# Patient Record
Sex: Female | Born: 1970 | Race: White | Hispanic: No | Marital: Married | Smoking: Never smoker
Health system: Southern US, Community
[De-identification: ages and names within clinical notes are randomized; demographics above are authoritative.]

## PROBLEM LIST (undated history)

## (undated) DIAGNOSIS — C44519 Basal cell carcinoma of skin of other part of trunk: Secondary | ICD-10-CM

## (undated) DIAGNOSIS — F329 Major depressive disorder, single episode, unspecified: Secondary | ICD-10-CM

## (undated) DIAGNOSIS — F32A Depression, unspecified: Secondary | ICD-10-CM

## (undated) DIAGNOSIS — D235 Other benign neoplasm of skin of trunk: Secondary | ICD-10-CM

## (undated) HISTORY — DX: Basal cell carcinoma of skin of other part of trunk: C44.519

## (undated) HISTORY — DX: Other benign neoplasm of skin of trunk: D23.5

## (undated) HISTORY — PX: NO PAST SURGERIES: SHX2092

---

## 2007-09-15 ENCOUNTER — Ambulatory Visit: Payer: Self-pay | Admitting: Family Medicine

## 2009-05-01 ENCOUNTER — Ambulatory Visit: Payer: Self-pay | Admitting: Family Medicine

## 2009-10-31 ENCOUNTER — Ambulatory Visit: Payer: Self-pay | Admitting: Family Medicine

## 2010-04-30 DIAGNOSIS — D235 Other benign neoplasm of skin of trunk: Secondary | ICD-10-CM

## 2010-04-30 HISTORY — DX: Other benign neoplasm of skin of trunk: D23.5

## 2014-02-10 ENCOUNTER — Ambulatory Visit: Payer: Self-pay | Admitting: Internal Medicine

## 2015-09-05 ENCOUNTER — Other Ambulatory Visit: Payer: Self-pay | Admitting: Family Medicine

## 2015-09-05 DIAGNOSIS — E040 Nontoxic diffuse goiter: Secondary | ICD-10-CM

## 2015-10-30 ENCOUNTER — Ambulatory Visit
Admission: RE | Admit: 2015-10-30 | Discharge: 2015-10-30 | Disposition: A | Discharge status: Home / Self Care | Payer: BC Managed Care – PPO | Source: Ambulatory Visit | Attending: Family Medicine | Admitting: Family Medicine

## 2015-10-30 DIAGNOSIS — E040 Nontoxic diffuse goiter: Secondary | ICD-10-CM

## 2015-11-09 ENCOUNTER — Other Ambulatory Visit: Payer: Self-pay | Admitting: Family Medicine

## 2015-11-09 DIAGNOSIS — E041 Nontoxic single thyroid nodule: Secondary | ICD-10-CM

## 2015-11-20 ENCOUNTER — Ambulatory Visit: Admission: RE | Admit: 2015-11-20 | Payer: BC Managed Care – PPO | Source: Ambulatory Visit

## 2015-12-07 ENCOUNTER — Ambulatory Visit
Admission: RE | Admit: 2015-12-07 | Discharge: 2015-12-07 | Disposition: A | Discharge status: Home / Self Care | Payer: BC Managed Care – PPO | Source: Ambulatory Visit | Attending: Family Medicine | Admitting: Family Medicine

## 2015-12-07 DIAGNOSIS — E041 Nontoxic single thyroid nodule: Secondary | ICD-10-CM | POA: Diagnosis not present

## 2015-12-07 HISTORY — DX: Major depressive disorder, single episode, unspecified: F32.9

## 2015-12-07 HISTORY — DX: Depression, unspecified: F32.A

## 2015-12-07 NOTE — Procedures (Signed)
L thyroid nodule FNA No comp/EBL

## 2015-12-08 LAB — CYTOLOGY - NON PAP

## 2017-05-26 DIAGNOSIS — C44519 Basal cell carcinoma of skin of other part of trunk: Secondary | ICD-10-CM

## 2017-05-26 HISTORY — DX: Basal cell carcinoma of skin of other part of trunk: C44.519

## 2017-09-17 ENCOUNTER — Other Ambulatory Visit: Payer: Self-pay | Admitting: Family Medicine

## 2017-09-17 DIAGNOSIS — N950 Postmenopausal bleeding: Secondary | ICD-10-CM

## 2017-09-22 ENCOUNTER — Ambulatory Visit: Payer: BC Managed Care – PPO

## 2018-09-26 ENCOUNTER — Other Ambulatory Visit: Payer: Self-pay | Admitting: *Deleted

## 2018-09-26 DIAGNOSIS — Z20822 Contact with and (suspected) exposure to covid-19: Secondary | ICD-10-CM

## 2018-10-02 ENCOUNTER — Telehealth: Payer: Self-pay | Admitting: Family Medicine

## 2018-10-02 LAB — NOVEL CORONAVIRUS, NAA: SARS-CoV-2, NAA: NOT DETECTED

## 2018-10-02 NOTE — Telephone Encounter (Signed)
Patient was given her COVID results. Patient expressed understanding of results.

## 2019-09-06 ENCOUNTER — Other Ambulatory Visit: Payer: Self-pay

## 2019-09-06 ENCOUNTER — Ambulatory Visit: Payer: BC Managed Care – PPO | Admitting: Dermatology

## 2019-09-06 DIAGNOSIS — L720 Epidermal cyst: Secondary | ICD-10-CM

## 2019-09-06 MED ORDER — DOXYCYCLINE HYCLATE 100 MG PO CAPS: 100.0000 mg | ORAL_CAPSULE | Freq: Two times a day (BID) | ORAL | 0 refills | Status: DC

## 2019-09-06 NOTE — Patient Instructions (Signed)
Doxycycline 100mg   Take 1 pill two times a day with food and drink for 2 weeks, then decrease to 1 pill a day for the remainder of the prescription.

## 2019-09-06 NOTE — Progress Notes (Signed)
   Follow-Up Visit   Subjective  Dana Heath is a 49 y.o. female who presents for the following: Cyst (back x 2wks, painful, no hx draining).   The following portions of the chart were reviewed this encounter and updated as appropriate:      Review of Systems:  No other skin or systemic complaints except as noted in HPI or Assessment and Plan.  Objective  Well appearing patient in no apparent distress; mood and affect are within normal limits.  A focused examination was performed including back. Relevant physical exam findings are noted in the Assessment and Plan.  Objective  Right Upper Back: 2.5cm firm sub q nodule with overlying mild erythema   Assessment & Plan  Epidermal cyst Right Upper Back  Inflamed  Start Doxycycline 100mg  1 po bid x 2 weeks, then 1 po qd until finish, take with food and drink  Discussed excising in future  Doxycycline should be taken with food to prevent nausea. Do not lay down for 30 minutes after taking. Be cautious with sun exposure and use good sun protection while on this medication. Pregnant women should not take this medication.    doxycycline (VIBRAMYCIN) 100 MG capsule - Right Upper Back  Return for as scheduled with Dr. Nehemiah Massed.   I, Othelia Pulling, RMA, am acting as scribe for Brendolyn Patty, MD .  Documentation: I have reviewed the above documentation for accuracy and completeness, and I agree with the above.  Brendolyn Patty MD

## 2019-09-20 ENCOUNTER — Other Ambulatory Visit: Payer: Self-pay

## 2019-09-20 ENCOUNTER — Ambulatory Visit: Payer: BC Managed Care – PPO | Admitting: Dermatology

## 2019-09-20 DIAGNOSIS — D1800 Hemangioma unspecified site: Secondary | ICD-10-CM

## 2019-09-20 DIAGNOSIS — Z1283 Encounter for screening for malignant neoplasm of skin: Secondary | ICD-10-CM | POA: Diagnosis not present

## 2019-09-20 DIAGNOSIS — D239 Other benign neoplasm of skin, unspecified: Secondary | ICD-10-CM

## 2019-09-20 DIAGNOSIS — L570 Actinic keratosis: Secondary | ICD-10-CM | POA: Diagnosis not present

## 2019-09-20 DIAGNOSIS — D225 Melanocytic nevi of trunk: Secondary | ICD-10-CM | POA: Diagnosis not present

## 2019-09-20 DIAGNOSIS — L578 Other skin changes due to chronic exposure to nonionizing radiation: Secondary | ICD-10-CM

## 2019-09-20 DIAGNOSIS — L720 Epidermal cyst: Secondary | ICD-10-CM

## 2019-09-20 DIAGNOSIS — D229 Melanocytic nevi, unspecified: Secondary | ICD-10-CM

## 2019-09-20 DIAGNOSIS — Z85828 Personal history of other malignant neoplasm of skin: Secondary | ICD-10-CM

## 2019-09-20 DIAGNOSIS — L739 Follicular disorder, unspecified: Secondary | ICD-10-CM

## 2019-09-20 DIAGNOSIS — D2371 Other benign neoplasm of skin of right lower limb, including hip: Secondary | ICD-10-CM

## 2019-09-20 DIAGNOSIS — R21 Rash and other nonspecific skin eruption: Secondary | ICD-10-CM | POA: Diagnosis not present

## 2019-09-20 DIAGNOSIS — D492 Neoplasm of unspecified behavior of bone, soft tissue, and skin: Secondary | ICD-10-CM

## 2019-09-20 DIAGNOSIS — L821 Other seborrheic keratosis: Secondary | ICD-10-CM

## 2019-09-20 DIAGNOSIS — L814 Other melanin hyperpigmentation: Secondary | ICD-10-CM

## 2019-09-20 MED ORDER — TRIAMCINOLONE ACETONIDE 0.1 % EX CREA: 1.0000 "application " | TOPICAL_CREAM | Freq: Two times a day (BID) | CUTANEOUS | 1 refills | Status: AC | PRN

## 2019-09-20 NOTE — Progress Notes (Signed)
Follow-Up Visit   Subjective  Dana Heath is a 49 y.o. female who presents for the following: Annual Exam (TBSE, 1 year f/u, Hx of BCC,  hx of AKs ) and Rash (pt c/o red rash on her body, she started Doxycyline 2 weeks ago for a cyst ). The patient presents for Total-Body Skin Exam (TBSE) for skin cancer screening and mole check.  The following portions of the chart were reviewed this encounter and updated as appropriate:  Tobacco  Allergies  Meds  Problems  Med Hx  Surg Hx  Fam Hx     Review of Systems:  No other skin or systemic complaints except as noted in HPI or Assessment and Plan.  Objective  Well appearing patient in no apparent distress; mood and affect are within normal limits.  A full examination was performed including scalp, head, eyes, ears, nose, lips, neck, chest, axillae, abdomen, back, buttocks, bilateral upper extremities, bilateral lower extremities, hands, feet, fingers, toes, fingernails, and toenails. All findings within normal limits unless otherwise noted below.  Objective  Right Upper Back: 1.0 cm Subcutaneous nodule.   Objective  Right Upper Back: Firm pink/brown papulenodule with dimple sign.   Objective  R cheek: Erythematous thin papules/macules with gritty scale.   Objective  lower legs: folliculitis   Objective  R upper gastric: 0.6 cm irregular brown macule    Assessment & Plan    Epidermal cyst Right Upper Back Benign.  Discussed surgical option.  Dermatofibroma Right Upper Back benign Observe   AK (actinic keratosis) R cheek  Destruction of lesion - R cheek Complexity: simple   Destruction method: cryotherapy   Informed consent: discussed and consent obtained   Timeout:  patient name, date of birth, surgical site, and procedure verified Lesion destroyed using liquid nitrogen: Yes   Region frozen until ice ball extended beyond lesion: Yes   Outcome: patient tolerated procedure well with no complications    Post-procedure details: wound care instructions given    Folliculitis lower legs  Cont Doxycycline 100mg  bid x 2 days with food then stop Doxycycline   Rash - Phototoxic Sunburn from doxycycline arms, legs  Photo toxic sunburn from Doxycyline   Start Triamcinolone 0.1% cream apply to skin qd-bid avoid f/g/a   Ordered Medications: triamcinolone cream (KENALOG) 0.1 %  Neoplasm of skin R upper gastric = epigastric  Epidermal / dermal shaving  Lesion diameter (cm):  0.6 Informed consent: discussed and consent obtained   Timeout: patient name, date of birth, surgical site, and procedure verified   Procedure prep:  Patient was prepped and draped in usual sterile fashion Prep type:  Isopropyl alcohol Anesthesia: the lesion was anesthetized in a standard fashion   Anesthetic:  1% lidocaine w/ epinephrine 1-100,000 buffered w/ 8.4% NaHCO3 Hemostasis achieved with: pressure, aluminum chloride and electrodesiccation   Outcome: patient tolerated procedure well   Post-procedure details: sterile dressing applied and wound care instructions given   Dressing type: bandage and petrolatum   Additional details:  Post defect 0.9 cm   Specimen 1 - Surgical pathology Differential Diagnosis: R/O Dysplastic nevus  Check Margins: No 0.6 cm irregular brown macule   Lentigines - Scattered tan macules - Discussed due to sun exposure - Benign, observe - Call for any changes  Seborrheic Keratoses - Stuck-on, waxy, tan-brown papules and plaques  - Discussed benign etiology and prognosis. - Observe - Call for any changes  Melanocytic Nevi - Tan-brown and/or pink-flesh-colored symmetric macules and papules - Benign appearing on  exam today - Observation - Call clinic for new or changing moles - Recommend daily use of broad spectrum spf 30+ sunscreen to sun-exposed areas.   Hemangiomas - Red papules - Discussed benign nature - Observe - Call for any changes  Actinic Damage -  diffuse scaly erythematous macules with underlying dyspigmentation - Recommend daily broad spectrum sunscreen SPF 30+ to sun-exposed areas, reapply every 2 hours as needed.  - Call for new or changing lesions.  History of Basal Cell Carcinoma of the Skin Back  - No evidence of recurrence today - Recommend regular full body skin exams - Recommend daily broad spectrum sunscreen SPF 30+ to sun-exposed areas, reapply every 2 hours as needed.  - Call if any new or changing lesions are noted between office visits  Skin cancer screening performed today.  Return in about 1 year (around 09/19/2020). IMarye Round, CMA, am acting as scribe for Sarina Ser, MD .  Documentation: I have reviewed the above documentation for accuracy and completeness, and I agree with the above.  Sarina Ser, MD

## 2019-09-20 NOTE — Patient Instructions (Signed)

## 2019-09-21 ENCOUNTER — Encounter: Payer: Self-pay | Admitting: Dermatology

## 2019-09-22 ENCOUNTER — Telehealth: Payer: Self-pay

## 2019-09-22 NOTE — Telephone Encounter (Signed)
-----   Message from Ralene Bathe, MD sent at 09/21/2019  7:21 PM EDT ----- Skin , right upper gastric MELANOCYTIC NEVUS, INTRADERMAL TYPE  Benign mole

## 2019-09-22 NOTE — Telephone Encounter (Signed)
Patient advised biopsy results showed benign nevus, Dana Heath

## 2020-05-15 ENCOUNTER — Ambulatory Visit
Admission: RE | Admit: 2020-05-15 | Discharge: 2020-05-15 | Disposition: A | Discharge status: Home / Self Care | Payer: BC Managed Care – PPO | Source: Ambulatory Visit | Attending: Family Medicine | Admitting: Family Medicine

## 2020-05-15 ENCOUNTER — Other Ambulatory Visit: Payer: Self-pay | Admitting: Family Medicine

## 2020-05-15 ENCOUNTER — Other Ambulatory Visit: Payer: Self-pay

## 2020-05-15 DIAGNOSIS — Z1231 Encounter for screening mammogram for malignant neoplasm of breast: Secondary | ICD-10-CM

## 2020-09-21 ENCOUNTER — Encounter: Payer: BC Managed Care – PPO | Admitting: Dermatology

## 2020-11-09 ENCOUNTER — Ambulatory Visit: Payer: BC Managed Care – PPO | Admitting: Dermatology

## 2020-11-16 ENCOUNTER — Ambulatory Visit: Payer: BC Managed Care – PPO | Admitting: Dermatology

## 2021-04-05 ENCOUNTER — Ambulatory Visit: Payer: BC Managed Care – PPO | Admitting: Dermatology

## 2021-08-25 IMAGING — MG MM DIGITAL SCREENING BILAT W/ TOMO AND CAD
6 of 10 series · 6 of 30 positions shown · non-contrast
Comparison: Previous exam(s).

CLINICAL DATA: Screening.

EXAM:
DIGITAL SCREENING BILATERAL MAMMOGRAM WITH TOMOSYNTHESIS AND CAD
TECHNIQUE: Bilateral screening digital craniocaudal and mediolateral oblique
mammograms were obtained. Bilateral screening digital breast
tomosynthesis was performed. The images were evaluated with
computer-aided detection.

[L CC synth-2D]
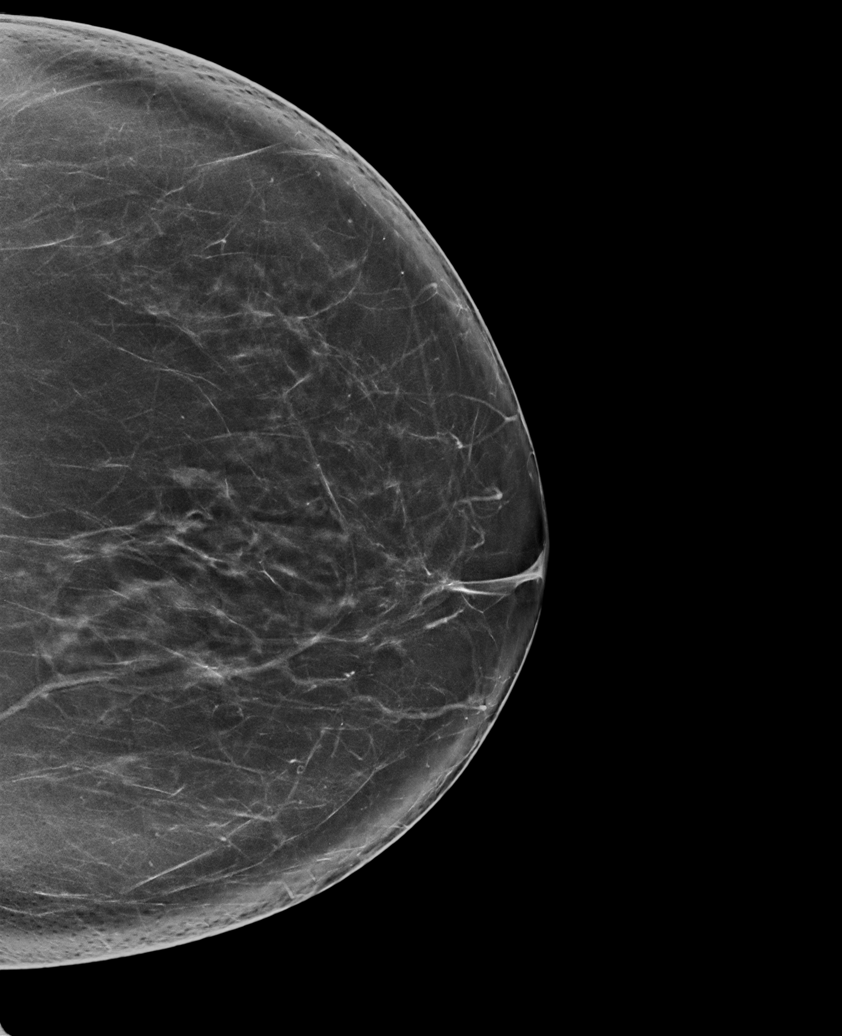

[R CC synth-2D (1 of 2)]
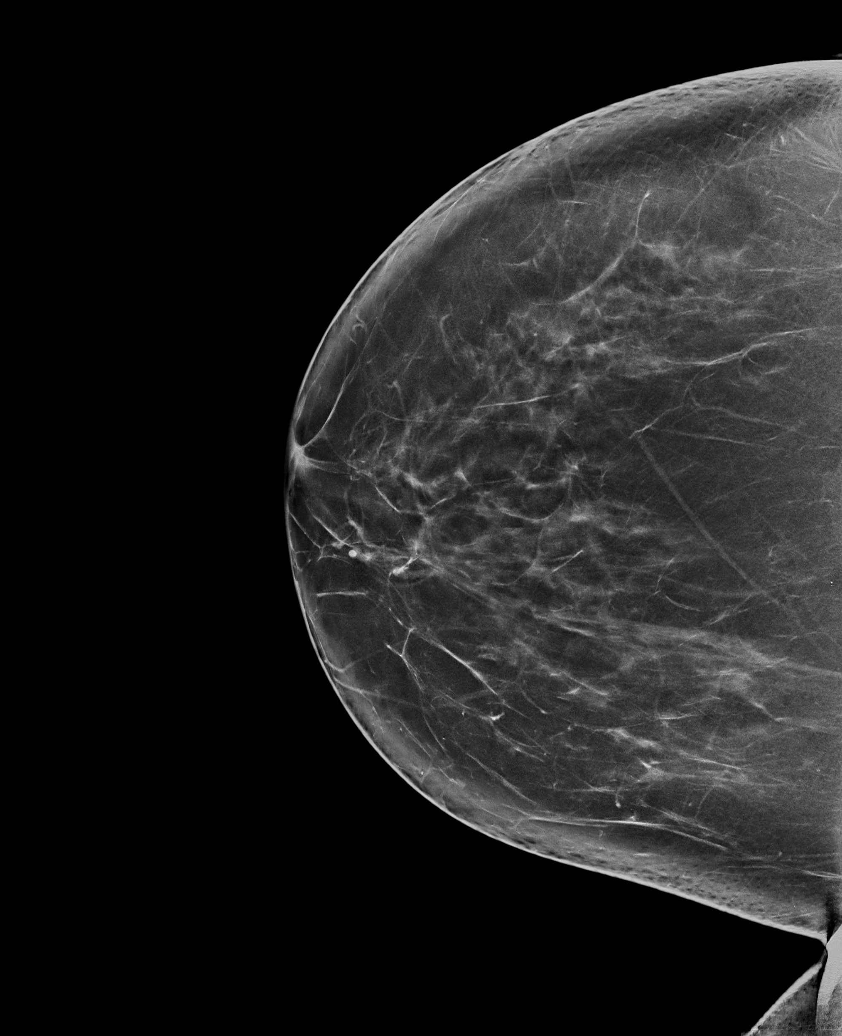

[L MLO synth-2D]
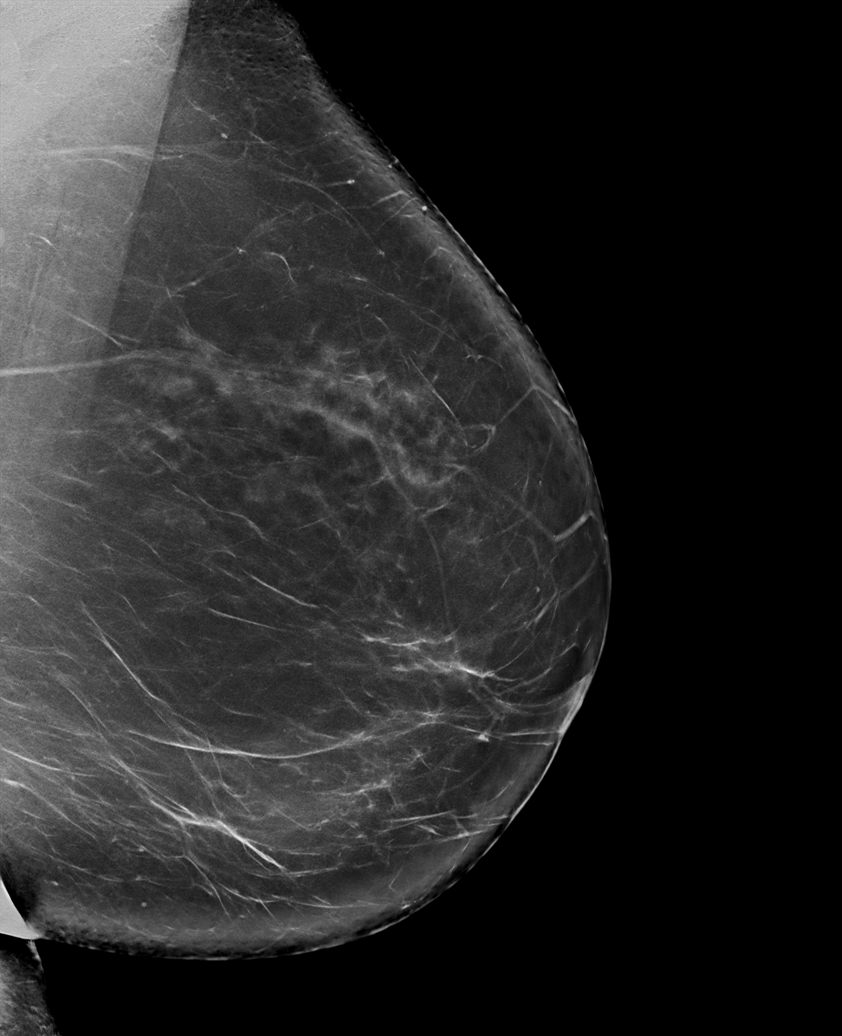

[R MLO synth-2D]
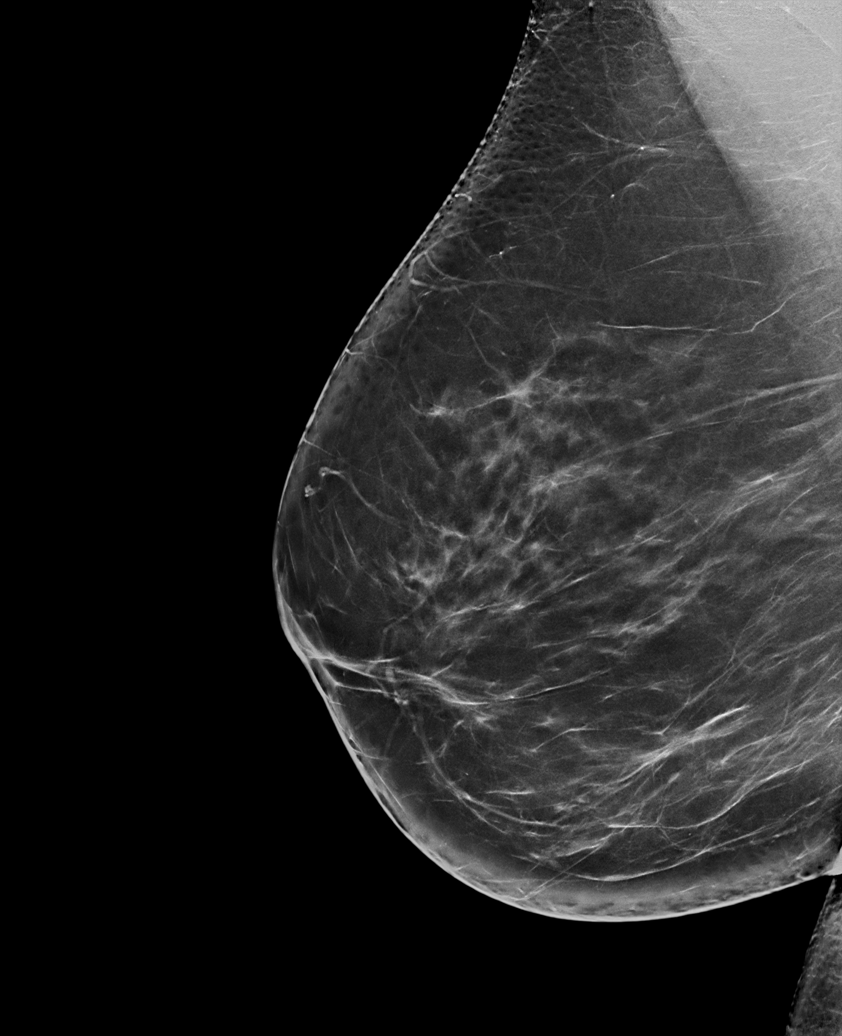

[R CC synth-2D (2 of 2)]
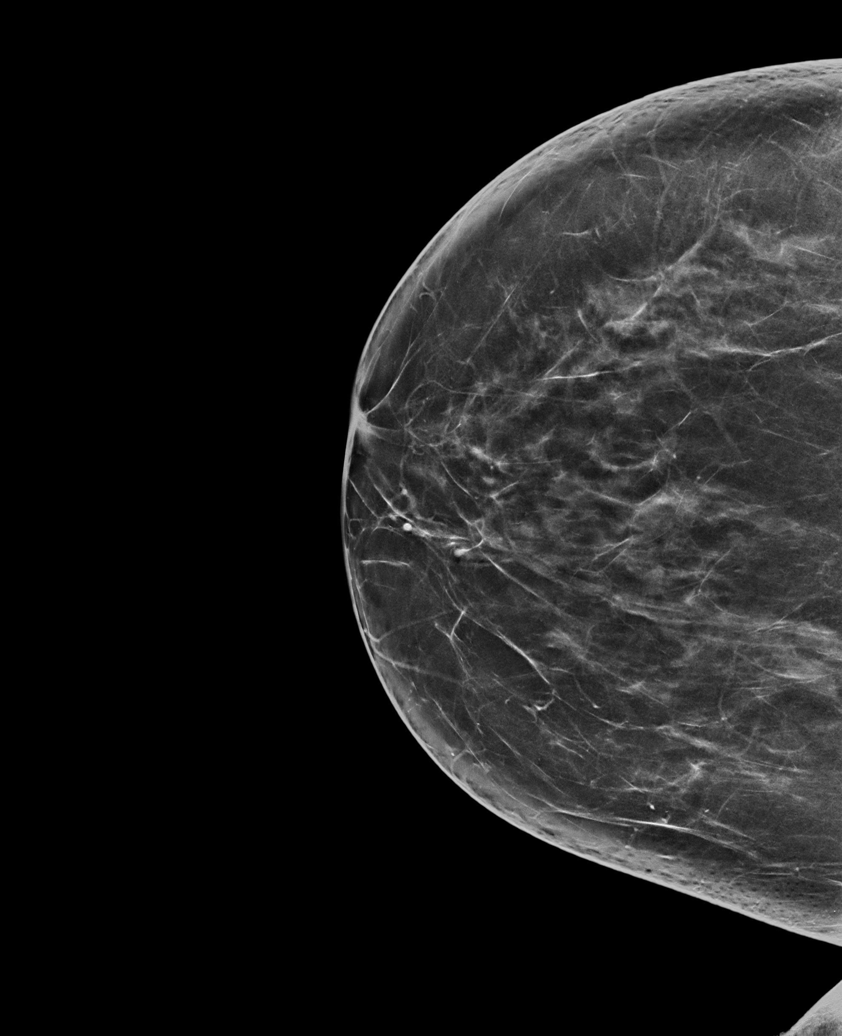

[R CC tomo · tomo slice 47/92.0]
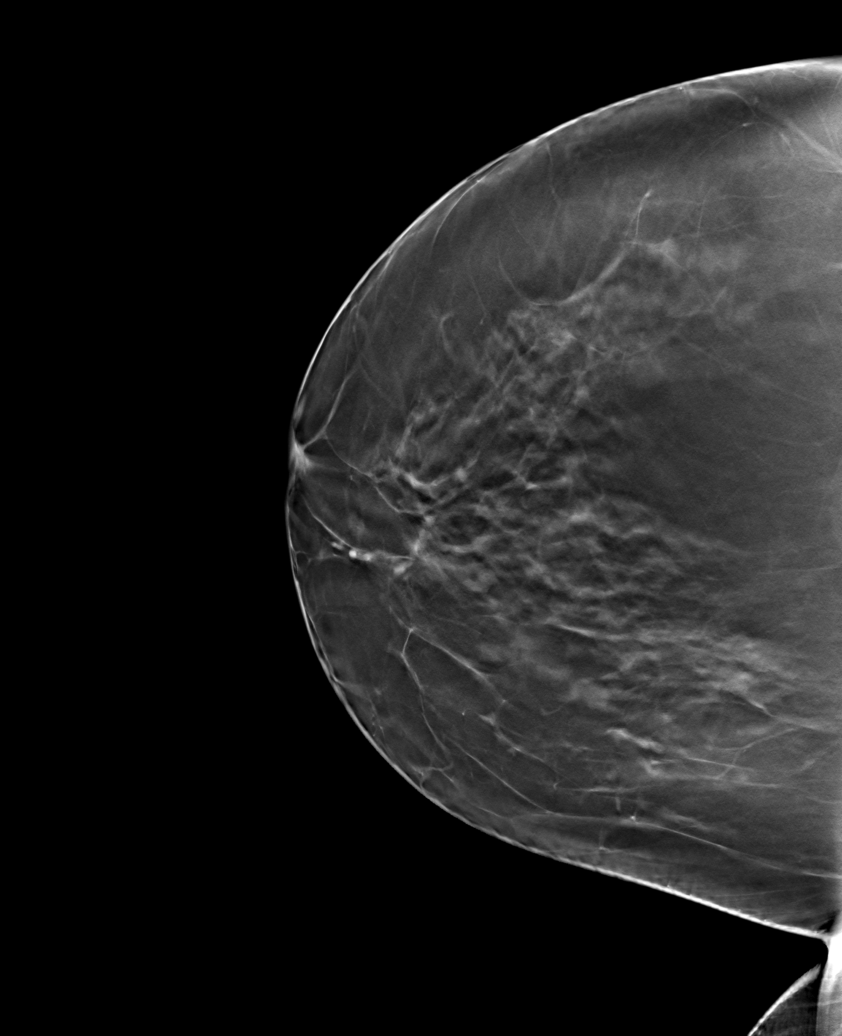

[6 of 30 positions shown; findings below may reference images not displayed]

ACR Breast Density Category b: There are scattered areas of
fibroglandular density.
FINDINGS: There are no findings suspicious for malignancy.
IMPRESSION: No mammographic evidence of malignancy. A result letter of this
screening mammogram will be mailed directly to the patient.

RECOMMENDATION:
Screening mammogram in one year. (Code:51-O-LD2)

BI-RADS CATEGORY  1: Negative.
